# Patient Record
Sex: Female | Born: 1965 | Race: Black or African American | Hispanic: No | Smoking: Never smoker
Health system: Southern US, Community
[De-identification: ages and names within clinical notes are randomized; demographics above are authoritative.]

## PROBLEM LIST (undated history)

## (undated) DIAGNOSIS — M51369 Other intervertebral disc degeneration, lumbar region without mention of lumbar back pain or lower extremity pain: Secondary | ICD-10-CM

## (undated) DIAGNOSIS — I10 Essential (primary) hypertension: Secondary | ICD-10-CM

## (undated) DIAGNOSIS — M419 Scoliosis, unspecified: Secondary | ICD-10-CM

## (undated) DIAGNOSIS — M5136 Other intervertebral disc degeneration, lumbar region: Secondary | ICD-10-CM

## (undated) DIAGNOSIS — K219 Gastro-esophageal reflux disease without esophagitis: Secondary | ICD-10-CM

## (undated) HISTORY — PX: ABLATION: SHX5711

## (undated) HISTORY — PX: TUBAL LIGATION: SHX77

---

## 2021-01-12 ENCOUNTER — Other Ambulatory Visit: Payer: Self-pay

## 2021-01-12 ENCOUNTER — Encounter (HOSPITAL_BASED_OUTPATIENT_CLINIC_OR_DEPARTMENT_OTHER): Payer: Self-pay | Admitting: *Deleted

## 2021-01-12 ENCOUNTER — Emergency Department (HOSPITAL_BASED_OUTPATIENT_CLINIC_OR_DEPARTMENT_OTHER)
Admission: EM | Admit: 2021-01-12 | Discharge: 2021-01-12 | Disposition: A | Discharge status: Home / Self Care | Payer: Self-pay | Attending: Emergency Medicine | Admitting: Emergency Medicine

## 2021-01-12 ENCOUNTER — Emergency Department (HOSPITAL_BASED_OUTPATIENT_CLINIC_OR_DEPARTMENT_OTHER): Payer: Self-pay

## 2021-01-12 DIAGNOSIS — R52 Pain, unspecified: Secondary | ICD-10-CM

## 2021-01-12 DIAGNOSIS — I10 Essential (primary) hypertension: Secondary | ICD-10-CM | POA: Insufficient documentation

## 2021-01-12 DIAGNOSIS — M25562 Pain in left knee: Secondary | ICD-10-CM

## 2021-01-12 HISTORY — DX: Essential (primary) hypertension: I10

## 2021-01-12 HISTORY — DX: Other intervertebral disc degeneration, lumbar region: M51.36

## 2021-01-12 HISTORY — DX: Other intervertebral disc degeneration, lumbar region without mention of lumbar back pain or lower extremity pain: M51.369

## 2021-01-12 HISTORY — DX: Scoliosis, unspecified: M41.9

## 2021-01-12 HISTORY — DX: Gastro-esophageal reflux disease without esophagitis: K21.9

## 2021-01-12 MED ORDER — OXYCODONE-ACETAMINOPHEN 5-325 MG PO TABS: 1.0000 | ORAL_TABLET | Freq: Four times a day (QID) | ORAL | 0 refills | Status: AC | PRN

## 2021-01-12 NOTE — ED Notes (Signed)
Discharge instructions including pain management, prescription, and follow up care discussed with pt pt verbalized understanding with no questions at this time. Left knee brace applied by NT Zella Ball

## 2021-01-12 NOTE — Discharge Instructions (Signed)
Please follow up with Dr. Jordan Likes for further evaluation of your knee pain. If you should need anything surgical you can follow up with Dr. Ophelia Charter in the future.   Pick up medication and take as prescribed. While at home please rest, ice, and elevate your knee as much as possible. I would recommend OTC Voltaren gel to apply to your knee to help with pain. Wear knee brace daily while on your feet/at work.   Return to the ED for any new/worsening symptoms

## 2021-01-12 NOTE — ED Triage Notes (Signed)
C/o left knee pain and swelling x 1 week, denies injury  

## 2021-01-12 NOTE — ED Provider Notes (Signed)
[facility] EMERGENCY DEPARTMENT Provider Note   CSN: [id number] Arrival date & time: 01/12/21  2224     History Chief Complaint  Patient presents with   Knee Pain    Tina Joseph is a 55 y.o. female with complaint of gradual onset, constant, achy, left knee pain for the past 3 weeks.  Patient denies any injury to same.  She mentions that she was told several years ago that she had decreased cartilage in her knee.  She states that she had lost weight and her knees felt better however recently she has been under a lot of stress and gained about 15 extra pounds.  She is wondering if this is causing any increased pressure on her knee.  She states that she has been taking over-the-counter medications without relief.  She also tried applying Biofreeze however states this made the pain worse.  She has significant pain with bending of her knee.  Denies any fevers, chills, redness to the knee.  She denies any IV drug use.   The history is provided by the patient and medical records.      Past Medical History:  Diagnosis Date   Degenerative disc disease, lumbar    GERD (gastroesophageal reflux disease)    Hypertension    Scoliosis     There are no problems to display for this patient.   Past Surgical History:  Procedure Laterality Date   ABLATION     TUBAL LIGATION       OB History   No obstetric history on file.     No family history on file.  Social History   Tobacco Use   Smoking status: Never   Smokeless tobacco: Never    Home Medications Prior to Admission medications   Medication Sig Start Date End Date Taking? Authorizing Provider  oxyCODONE-acetaminophen (PERCOCET/ROXICET) 5-325 MG tablet Take 1 tablet by mouth every 6 (six) hours as needed for severe pain. 01/12/21  Yes Imanol Bihl, PA-C    Allergies    Iodine, Levocarnitine, and Iodinated diagnostic agents  Review of Systems   Review of Systems  Constitutional:  Negative for chills and  fever.  Musculoskeletal:  Positive for arthralgias.  Skin:  Negative for color change.  All other systems reviewed and are negative.  Physical Exam Updated Vital Signs BP 119/81   Pulse 73   Temp (!) 97.5 F (36.4 C) (Oral)   Resp 16   Ht 5\' 3"  (1.6 m)   Wt 96.2 kg   SpO2 97%   BMI 37.55 kg/m   Physical Exam Vitals and nursing note reviewed.  Constitutional:      Appearance: She is obese. She is not ill-appearing or diaphoretic.  HENT:     Head: Normocephalic and atraumatic.  Eyes:     Conjunctiva/sclera: Conjunctivae normal.  Cardiovascular:     Rate and Rhythm: Normal rate and regular rhythm.  Pulmonary:     Effort: Pulmonary effort is normal.     Breath sounds: Normal breath sounds.  Musculoskeletal:     Comments: Mild swelling noted to L knee compared to R. No erythema or increased warmth. + TTP to the anterior aspect of the knee along joint line. ROM limited s/2 pain however pt able to bend and extend slowly. Negative anterior and posterior drawer test. No varus or valgus laxity. 2+ DP pulse.   Skin:    General: Skin is warm and dry.     Coloration: Skin is not jaundiced.  Neurological:  Mental Status: She is alert.    ED Results / Procedures / Treatments   Labs (all labs ordered are listed, but only abnormal results are displayed) Labs Reviewed - No data to display  EKG None  Radiology DG Knee Complete 4 Views Left  Result Date: 01/12/2021 CLINICAL DATA:  Knee pain EXAM: LEFT KNEE - COMPLETE 4+ VIEW COMPARISON:  None. FINDINGS: No fracture or dislocation is seen. Moderate tricompartmental degenerative changes, most prominent in the medial and patellofemoral compartments. Visualized soft tissues are within normal limits. No suprapatellar knee joint effusion. IMPRESSION: Moderate degenerative changes. Electronically Signed   By: Charline Bills M.D.   On: 01/12/2021 23:08    Procedures Procedures   Medications Ordered in ED Medications - No data to  display  ED Course  I have reviewed the triage vital signs and the nursing notes.  Pertinent labs & imaging results that were available during my care of the patient were reviewed by me and considered in my medical decision making (see chart for details).    MDM Rules/Calculators/A&P                           55 year old female who presents to the ED today with atraumatic left knee pain for the past 3 weeks.  On arrival to the ED today vitals are stable.  Patient had an x-ray done prior to being seen which does show some moderate degenerative changes.  No acute fractures.  My exam she is resting comfortably.  She is noted to have some mild swelling to the left knee compared to the right however no overlying skin changes including redness or increased warmth.  She has tenderness palpation towards the anterior aspect.  Her range of motion is limited secondary to pain however she is able to flex and extend.  She has no ligamentous instability.  I do suspect her pain is likely related to degeneration.  He does report that she has had steroid injections in the knee in the past.  I do feel she would likely benefit from this.  We will plan to have her follow-up with sports medicine for further eval.  Patient to be discharged with a short course of pain medication.  Have provided knee brace here today.  Have instructed on RICE therapy.  Also recommended over-the-counter Voltaren gel for symptomatic relief.  Patient is in agreement with plan and stable for discharge.   This note was prepared using Dragon voice recognition software and may include unintentional dictation errors due to the inherent limitations of voice recognition software.   Final Clinical Impression(s) / ED Diagnoses Final diagnoses:  Acute pain of left knee    Rx / DC Orders ED Discharge Orders          Ordered    oxyCODONE-acetaminophen (PERCOCET/ROXICET) 5-325 MG tablet  Every 6 hours PRN        01/12/21 2333              Discharge Instructions      Please follow up with Dr. Jordan Likes for further evaluation of your knee pain. If you should need anything surgical you can follow up with Dr. Ophelia Charter in the future.   Pick up medication and take as prescribed. While at home please rest, ice, and elevate your knee as much as possible. I would recommend OTC Voltaren gel to apply to your knee to help with pain. Wear knee brace daily while on your feet/at work.  Return to the ED for any new/worsening symptoms       Tanda Rockers, PA-C 01/12/21 2335    Sloan Leiter, DO 01/13/21 0003

## 2021-01-14 ENCOUNTER — Ambulatory Visit (INDEPENDENT_AMBULATORY_CARE_PROVIDER_SITE_OTHER): Payer: Self-pay | Admitting: Family Medicine

## 2021-01-14 ENCOUNTER — Encounter: Payer: Self-pay | Admitting: Family Medicine

## 2021-01-14 VITALS — BP 140/92 | Ht 63.0 in | Wt 212.0 lb

## 2021-01-14 DIAGNOSIS — M47817 Spondylosis without myelopathy or radiculopathy, lumbosacral region: Secondary | ICD-10-CM | POA: Insufficient documentation

## 2021-01-14 DIAGNOSIS — M1712 Unilateral primary osteoarthritis, left knee: Secondary | ICD-10-CM

## 2021-01-14 NOTE — Patient Instructions (Addendum)
Nice to meet you Please try heat on the back and ice on the knee  Please try the exercises   You can try voltaren on the knee Please send me a message in MyChart with any questions or updates.  Please see me back in 4 weeks.   --Dr. Jordan Likes

## 2021-01-14 NOTE — Assessment & Plan Note (Signed)
Has localized lower back pain that is likely related to facet changes. -Counseled on home exercise therapy and supportive care. -Referral to physical therapy. -Could consider imaging.

## 2021-01-14 NOTE — Progress Notes (Signed)
  Tina Joseph - 55 y.o. female MRN [id number]  Date of birth: Jun 21, 1965  SUBJECTIVE:  Including CC & ROS.  No chief complaint on file.   Tina Joseph is a 55 y.o. female that is presenting with acute left knee pain and low back pain.  The left knee is occurring over the medial compartment.  No injury or inciting event.  Has started noticing low back pain that is localized.  No radicular component.  Independent review of the left knee x-ray from 11/8 shows severe degenerative changes of the medial compartment.   Review of Systems See HPI   HISTORY: Past Medical, Surgical, Social, and Family History Reviewed & Updated per EMR.   Pertinent Historical Findings include:  Past Medical History:  Diagnosis Date   Degenerative disc disease, lumbar    GERD (gastroesophageal reflux disease)    Hypertension    Scoliosis     Past Surgical History:  Procedure Laterality Date   ABLATION     TUBAL LIGATION      History reviewed. No pertinent family history.  Social History   Socioeconomic History   Marital status: Divorced    Spouse name: Not on file   Number of children: Not on file   Years of education: Not on file   Highest education level: Not on file  Occupational History   Not on file  Tobacco Use   Smoking status: Never   Smokeless tobacco: Never  Substance and Sexual Activity   Alcohol use: Not on file   Drug use: Not on file   Sexual activity: Not on file  Other Topics Concern   Not on file  Social History Narrative   Not on file   Social Determinants of Health   Financial Resource Strain: Not on file  Food Insecurity: Not on file  Transportation Needs: Not on file  Physical Activity: Not on file  Stress: Not on file  Social Connections: Not on file  Intimate Partner Violence: Not on file     PHYSICAL EXAM:  VS: BP (!) 140/92 (BP Location: Left Arm, Patient Position: Sitting)   Ht 5\' 3"  (1.6 m)   Wt 212 lb (96.2 kg)   BMI 37.55 kg/m  Physical  Exam Gen: NAD, alert, cooperative with exam, well-appearing MSK:  Left knee: No obvious effusion. Normal range of motion. Normal strength resistance. Tenderness to palpation of the medial joint space. Instability valgus varus stress testing. Neurovascularly intact   ASSESSMENT & PLAN:   Primary osteoarthritis of left knee Degenerative changes appreciated on imaging. -Counseled on home exercise therapy and supportive care. -Counseled on Voltaren. -Left medial unloader brace due to thigh to calf ratio. -Referral to physical therapy. -Could consider injection.  Facet arthropathy, lumbosacral Has localized lower back pain that is likely related to facet changes. -Counseled on home exercise therapy and supportive care. -Referral to physical therapy. -Could consider imaging.

## 2021-01-14 NOTE — Assessment & Plan Note (Signed)
Degenerative changes appreciated on imaging. -Counseled on home exercise therapy and supportive care. -Counseled on Voltaren. -Left medial unloader brace due to thigh to calf ratio. -Referral to physical therapy. -Could consider injection.

## 2021-02-10 ENCOUNTER — Ambulatory Visit: Payer: Self-pay | Attending: Family Medicine | Admitting: Physical Therapy

## 2021-02-11 ENCOUNTER — Ambulatory Visit: Payer: Self-pay | Admitting: Family Medicine

## 2021-02-12 ENCOUNTER — Other Ambulatory Visit: Payer: Self-pay

## 2021-02-12 ENCOUNTER — Other Ambulatory Visit (HOSPITAL_BASED_OUTPATIENT_CLINIC_OR_DEPARTMENT_OTHER): Payer: Self-pay

## 2021-02-12 ENCOUNTER — Ambulatory Visit: Payer: Self-pay | Admitting: Family Medicine

## 2021-02-12 ENCOUNTER — Emergency Department (HOSPITAL_BASED_OUTPATIENT_CLINIC_OR_DEPARTMENT_OTHER): Payer: Self-pay

## 2021-02-12 ENCOUNTER — Encounter (HOSPITAL_BASED_OUTPATIENT_CLINIC_OR_DEPARTMENT_OTHER): Payer: Self-pay | Admitting: *Deleted

## 2021-02-12 ENCOUNTER — Emergency Department (HOSPITAL_BASED_OUTPATIENT_CLINIC_OR_DEPARTMENT_OTHER)
Admission: EM | Admit: 2021-02-12 | Discharge: 2021-02-12 | Disposition: A | Discharge status: Home / Self Care | Payer: Self-pay | Attending: Student | Admitting: Student

## 2021-02-12 DIAGNOSIS — U071 COVID-19: Secondary | ICD-10-CM | POA: Insufficient documentation

## 2021-02-12 DIAGNOSIS — I10 Essential (primary) hypertension: Secondary | ICD-10-CM | POA: Insufficient documentation

## 2021-02-12 LAB — RESP PANEL BY RT-PCR (FLU A&B, COVID) ARPGX2
Influenza A by PCR: NEGATIVE
Influenza B by PCR: NEGATIVE
SARS Coronavirus 2 by RT PCR: POSITIVE — AB

## 2021-02-12 MED ORDER — BENZONATATE 100 MG PO CAPS
100.0000 mg | ORAL_CAPSULE | Freq: Once | ORAL | Status: AC
  Administered 2021-02-12: 100 mg via ORAL
  Filled 2021-02-12: qty 1

## 2021-02-12 MED ORDER — ACETAMINOPHEN 325 MG PO TABS
650.0000 mg | ORAL_TABLET | Freq: Once | ORAL | Status: AC
  Administered 2021-02-12: 650 mg via ORAL
  Filled 2021-02-12: qty 2

## 2021-02-12 MED ORDER — BENZONATATE 100 MG PO CAPS
100.0000 mg | ORAL_CAPSULE | Freq: Three times a day (TID) | ORAL | 0 refills | Status: AC
  Filled 2021-02-12: qty 21, 7d supply, fill #0

## 2021-02-12 NOTE — ED Provider Notes (Signed)
[facility] EMERGENCY DEPARTMENT Provider Note   CSN: [id number] Arrival date & time: 02/12/21  1136     History Chief Complaint  Patient presents with   URI    Tina Joseph is a 55 y.o. female.   URI Presenting symptoms: congestion, cough and sore throat   Presenting symptoms: no fever   Associated symptoms: headaches and myalgias    Patient presents with cough, nasal congestion, sore throat x1-1/2 days.  Started acutely yesterday, symptoms have been intermittent but persistent.  The cough is dry nonproductive, has not tried any over-the-counter medicine.  She is COVID and flu vaccinated.  Unable to identify any aggravating factors, denies any chest pain.  Does have body aches diffusely throughout the body which started this morning.  Past Medical History:  Diagnosis Date   Degenerative disc disease, lumbar    GERD (gastroesophageal reflux disease)    Hypertension    Scoliosis     Patient Active Problem List   Diagnosis Date Noted   Primary osteoarthritis of left knee 01/14/2021   Facet arthropathy, lumbosacral 01/14/2021    Past Surgical History:  Procedure Laterality Date   ABLATION     TUBAL LIGATION       OB History   No obstetric history on file.     No family history on file.  Social History   Tobacco Use   Smoking status: Never   Smokeless tobacco: Never  Substance Use Topics   Alcohol use: Never   Drug use: Never    Home Medications Prior to Admission medications   Medication Sig Start Date End Date Taking? Authorizing Provider  oxyCODONE-acetaminophen (PERCOCET/ROXICET) 5-325 MG tablet Take 1 tablet by mouth every 6 (six) hours as needed for severe pain. 01/12/21  Yes Venter, Margaux, PA-C    Allergies    Iodine, Levocarnitine, and Iodinated diagnostic agents  Review of Systems   Review of Systems  Constitutional:  Negative for fever.  HENT:  Positive for congestion and sore throat.   Respiratory:  Positive for cough.  Negative for shortness of breath.   Cardiovascular:  Negative for chest pain.  Gastrointestinal:  Negative for diarrhea, nausea and vomiting.  Musculoskeletal:  Positive for myalgias.  Neurological:  Positive for headaches.   Physical Exam Updated Vital Signs BP (!) 147/77 (BP Location: Right Arm)   Pulse 91   Temp 99.4 F (37.4 C) (Oral)   Resp 20   Ht 5\' 3"  (1.6 m)   Wt 96.3 kg   SpO2 96%   BMI 37.61 kg/m   Physical Exam Vitals and nursing note reviewed. Exam conducted with a chaperone present.  Constitutional:      Appearance: Normal appearance.  HENT:     Head: Normocephalic.     Nose: Congestion present.     Mouth/Throat:     Pharynx: Posterior oropharyngeal erythema present.  Eyes:     Extraocular Movements: Extraocular movements intact.     Pupils: Pupils are equal, round, and reactive to light.  Cardiovascular:     Rate and Rhythm: Normal rate and regular rhythm.  Pulmonary:     Effort: Pulmonary effort is normal.     Breath sounds: Normal breath sounds.     Comments: Lungs CTA bilaterally. No accessory muscle use. Speaking in complete sentences.  Abdominal:     General: Abdomen is flat.     Palpations: Abdomen is soft.  Musculoskeletal:     Cervical back: Normal range of motion.  Neurological:  Mental Status: She is alert.  Psychiatric:        Mood and Affect: Mood normal.    ED Results / Procedures / Treatments   Labs (all labs ordered are listed, but only abnormal results are displayed) Labs Reviewed  RESP PANEL BY RT-PCR (FLU A&B, COVID) ARPGX2 - Abnormal; Notable for the following components:      Result Value   SARS Coronavirus 2 by RT PCR POSITIVE (*)    All other components within normal limits    EKG None  Radiology No results found.  Procedures Procedures   Medications Ordered in ED Medications  benzonatate (TESSALON) capsule 100 mg (has no administration in time range)  acetaminophen (TYLENOL) tablet 650 mg (650 mg Oral Given  02/12/21 1147)    ED Course  I have reviewed the triage vital signs and the nursing notes.  Pertinent labs & imaging results that were available during my care of the patient were reviewed by me and considered in my medical decision making (see chart for details).    MDM Rules/Calculators/A&P                           Stable vitals, nontoxic-appearing.  No hypoxia or tachypnea, no signs of respiratory distress.  No tachycardia or chest pain, symptoms are consistent with a viral process.  COVID flu swab ordered in triage, she is positive for COVID.  Radiograph negative for pneumonia.  Her cough improved with Jerilynn Som, will prescribe some outpatient.  Patient discharged in stable condition.    Final Clinical Impression(s) / ED Diagnoses Final diagnoses:  None    Rx / DC Orders ED Discharge Orders     None        Theron Arista, [place] 02/12/21 1446    Kommor, Wyn Forster, MD 02/12/21 1551

## 2021-02-12 NOTE — Discharge Instructions (Signed)
You have COVID.  Please self isolate at home for the next 5 days.  Drink plenty of fluid.  For fever, HA, sore throat or body aches - take tylenol (500-100 mg) every 8 hours. Do not exceed 3000mg /day. You ca also take ibuprofen.   For cough, take tessalon perles every 8 hours as needed. You can also use cough drops or drink honey.  Return if things worsen or change.

## 2021-02-12 NOTE — ED Notes (Signed)
X-ray at bedside

## 2021-02-12 NOTE — ED Notes (Signed)
D/c paperwork reviewed with pt, including prescription. All pt questions addressed prior to d/c. Ambulatory to ED exit on RA,. NAD.

## 2021-02-12 NOTE — ED Triage Notes (Signed)
Cough, sob, and congestion since yesterday. Body aches and sore throat this am.

## 2021-02-16 ENCOUNTER — Ambulatory Visit: Payer: Self-pay | Admitting: Physical Therapy

## 2021-03-04 ENCOUNTER — Encounter: Payer: Self-pay | Admitting: Physical Therapy

## 2021-03-11 ENCOUNTER — Encounter: Payer: Self-pay | Admitting: Physical Therapy

## 2021-03-25 ENCOUNTER — Encounter: Payer: Self-pay | Admitting: Physical Therapy

## 2021-04-09 ENCOUNTER — Ambulatory Visit: Payer: Self-pay | Admitting: Family

## 2022-06-22 ENCOUNTER — Encounter: Payer: Self-pay | Admitting: *Deleted

## 2022-09-20 IMAGING — DX DG KNEE COMPLETE 4+V*L*
4 series · 4 of 4 positions shown · non-contrast
Comparison: None.

CLINICAL DATA: Knee pain

EXAM:
LEFT KNEE - COMPLETE 4+ VIEW

[knee ap]
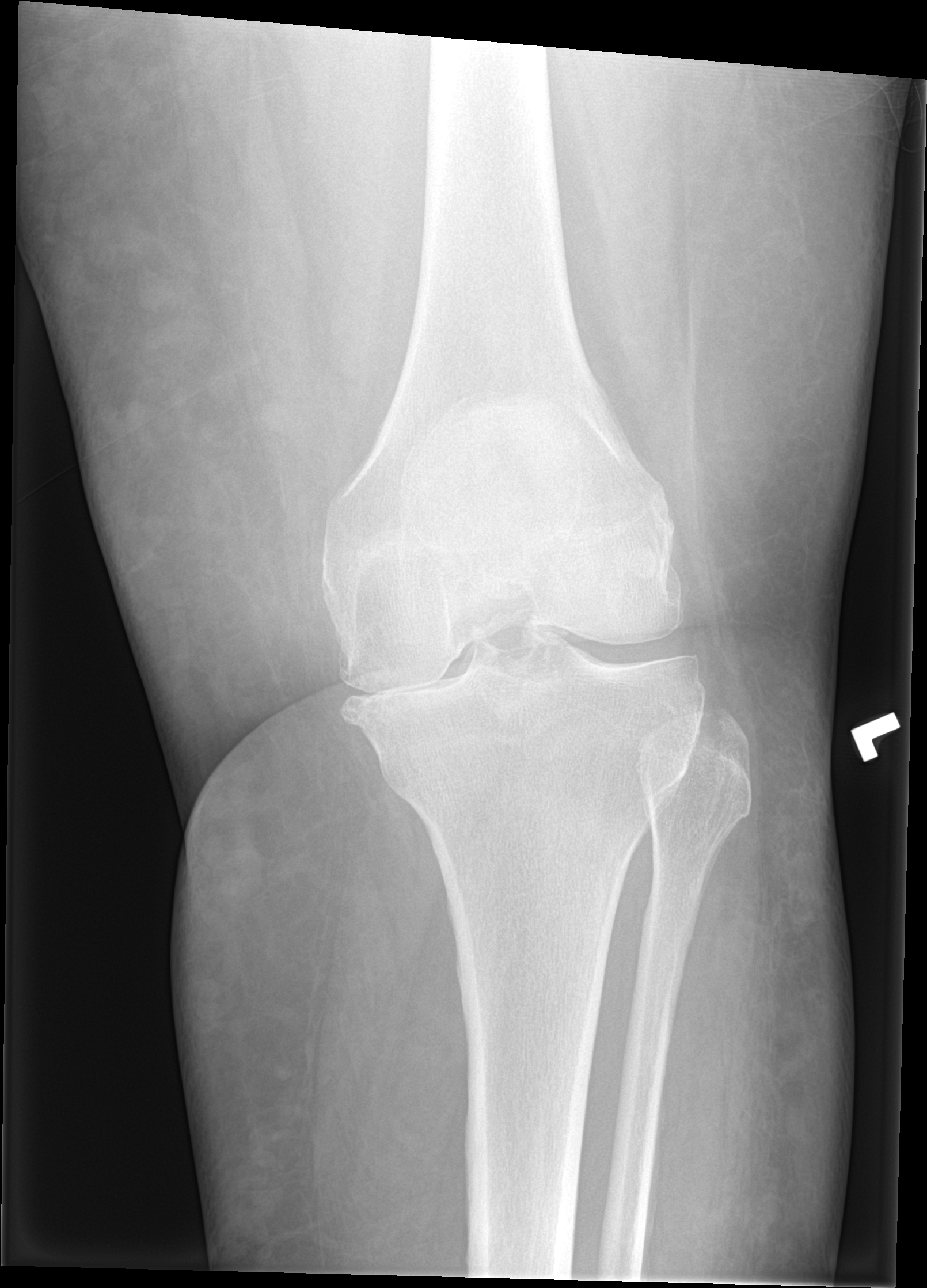

[knee lat]
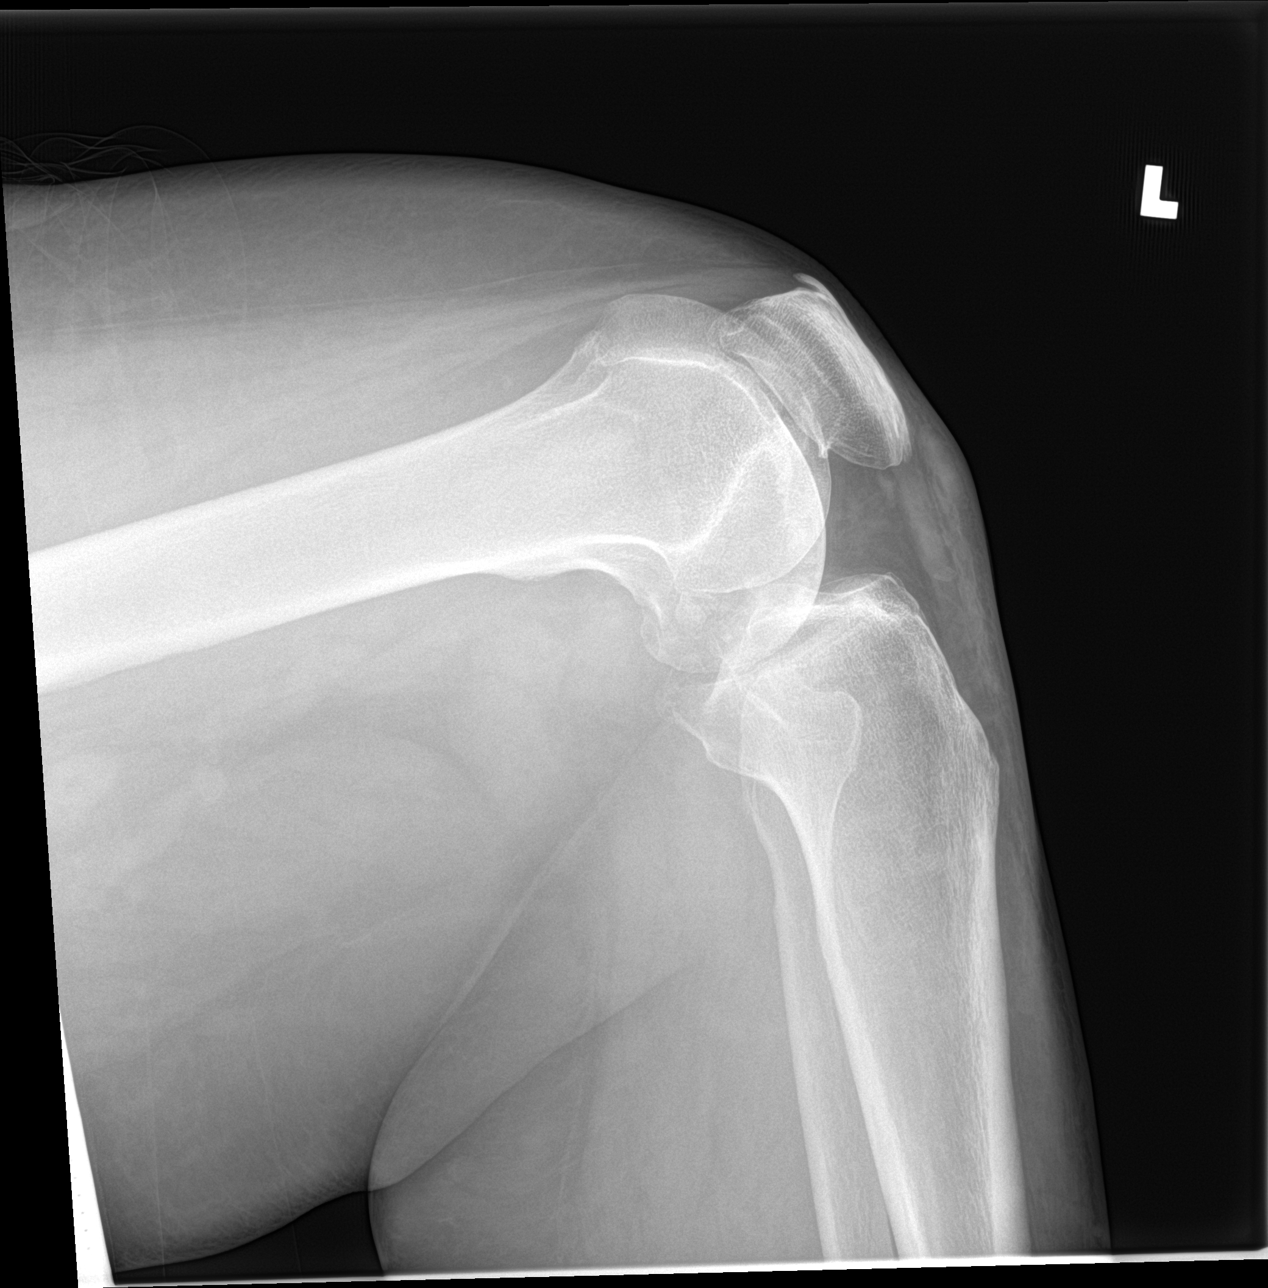

[knee obl (1 of 2)]
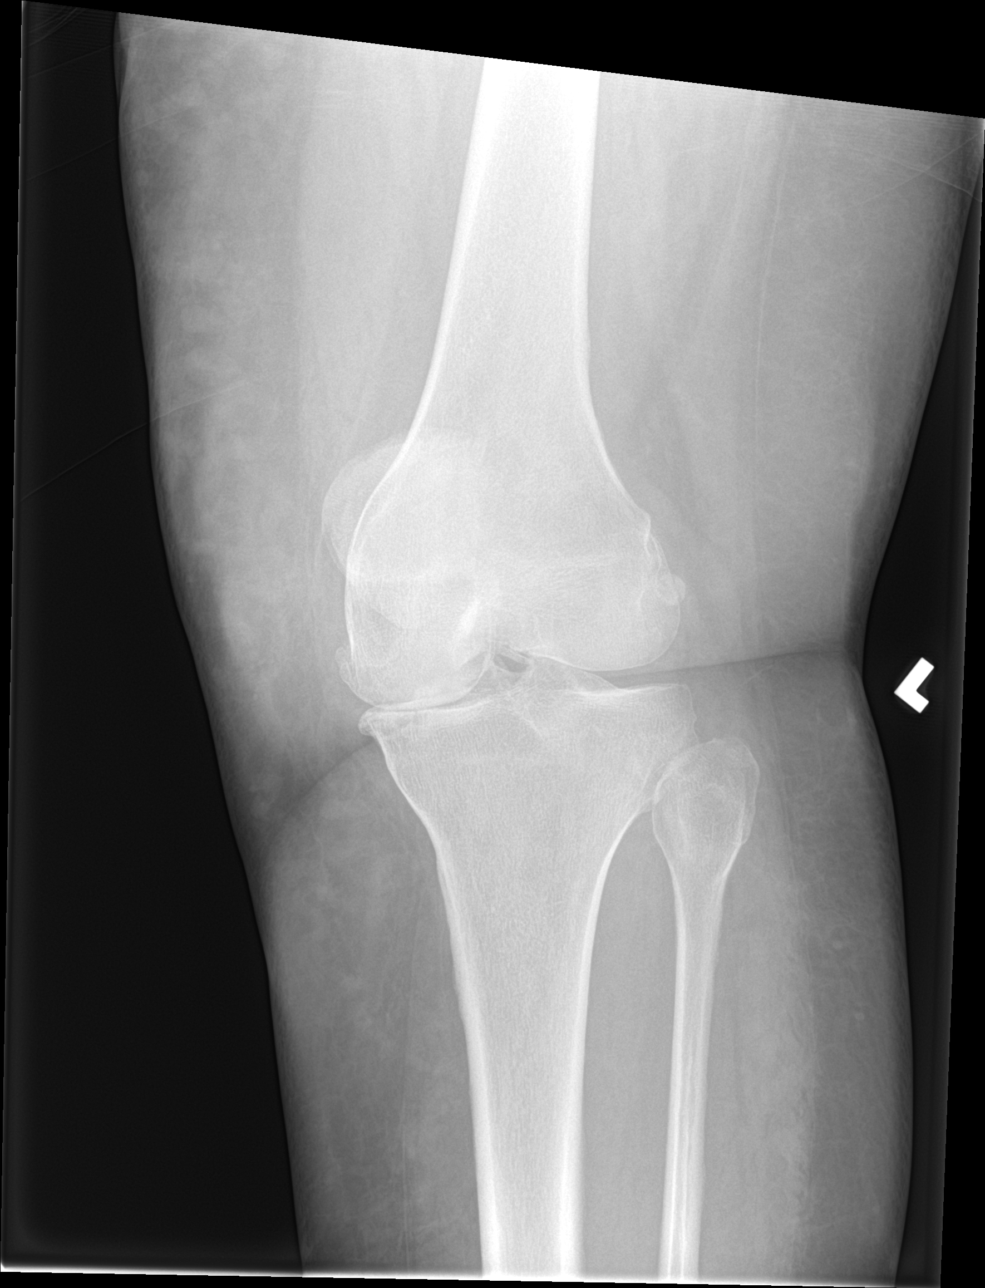

[knee obl (2 of 2)]
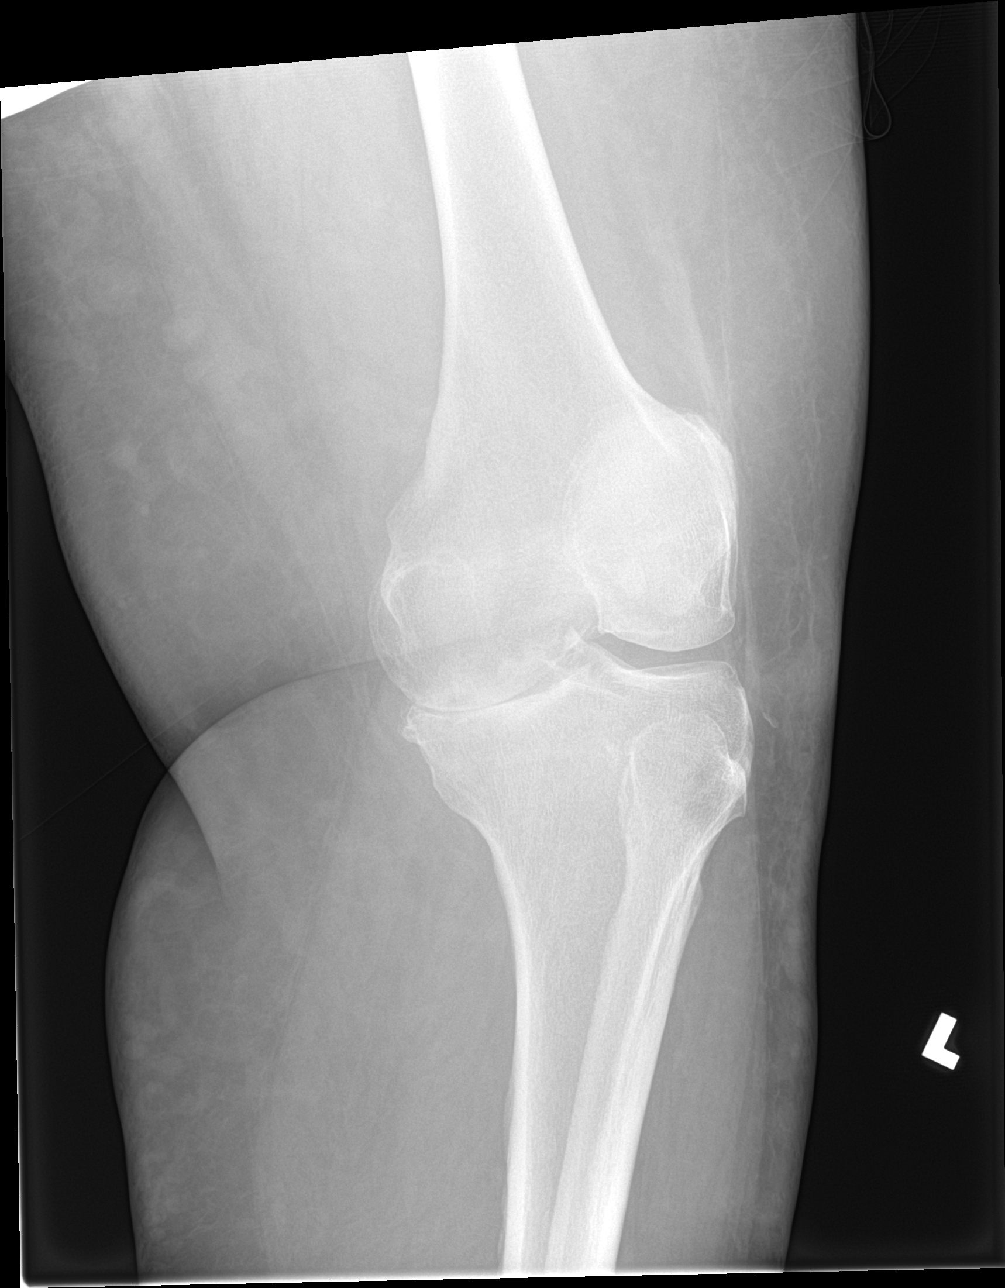

[4 of 4 positions shown; findings below may reference images not displayed]

FINDINGS: No fracture or dislocation is seen.

Moderate tricompartmental degenerative changes, most prominent in
the medial and patellofemoral compartments.

Visualized soft tissues are within normal limits.

No suprapatellar knee joint effusion.
IMPRESSION: Moderate degenerative changes.

## 2022-10-21 IMAGING — DX DG CHEST 1V PORT
1 series · 1 of 1 positions shown · non-contrast
Comparison: None.

CLINICAL DATA: Cough

EXAM:
PORTABLE CHEST 1 VIEW

[chest ap]
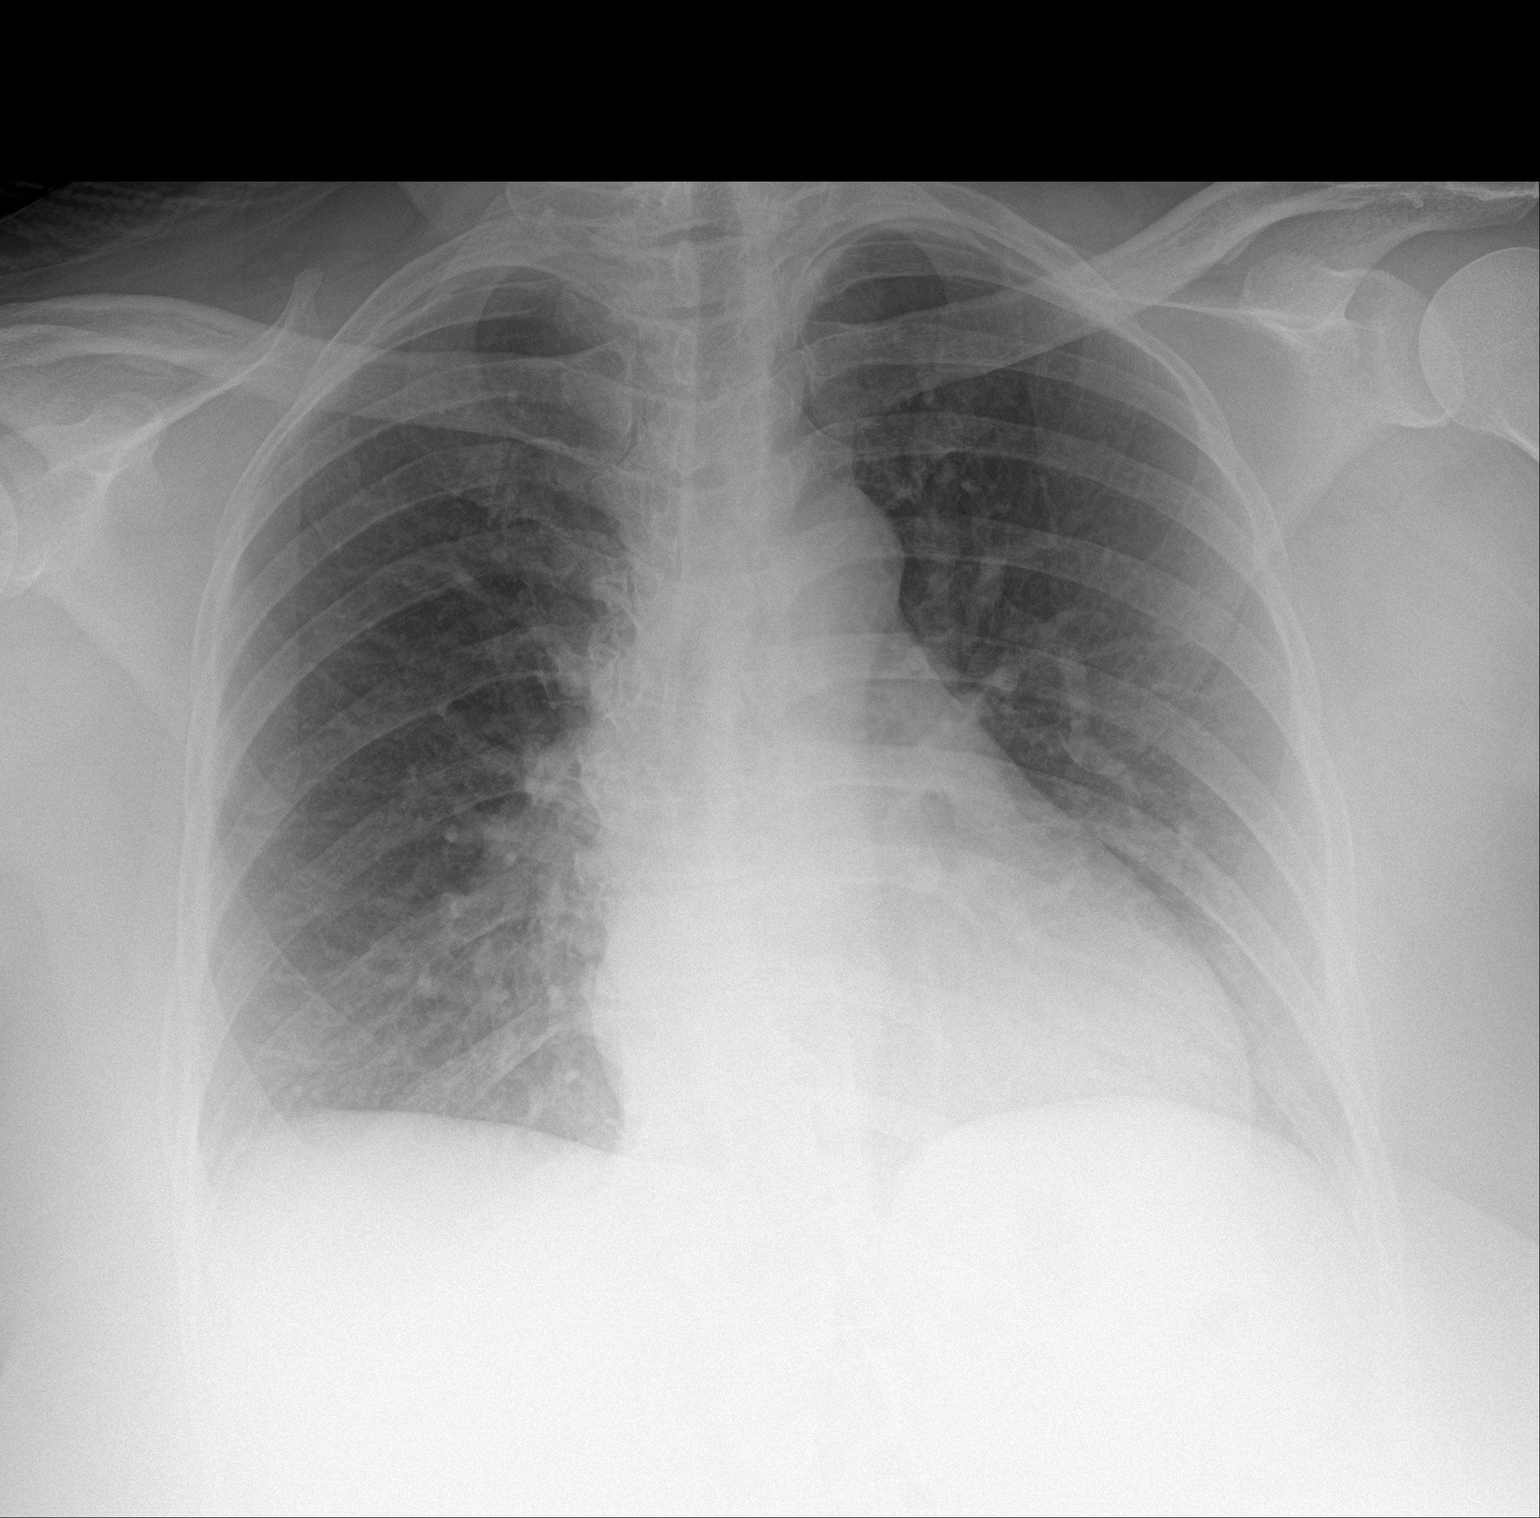

[1 of 1 positions shown; findings below may reference images not displayed]

FINDINGS: Heart size and mediastinal contours are within normal limits. No
suspicious pulmonary opacities identified.

No pleural effusion or pneumothorax visualized.

No acute osseous abnormality appreciated.
IMPRESSION: No acute intrathoracic process identified.
